# Patient Record
Sex: Male | Born: 1990 | Race: White | Hispanic: No | Marital: Single | Smoking: Never smoker
Health system: Southern US, Community
[De-identification: ages and names within clinical notes are randomized; demographics above are authoritative.]

## PROBLEM LIST (undated history)

## (undated) DIAGNOSIS — I10 Essential (primary) hypertension: Secondary | ICD-10-CM

## (undated) DIAGNOSIS — F845 Asperger's syndrome: Secondary | ICD-10-CM

## (undated) DIAGNOSIS — M1990 Unspecified osteoarthritis, unspecified site: Secondary | ICD-10-CM

## (undated) DIAGNOSIS — T8859XA Other complications of anesthesia, initial encounter: Secondary | ICD-10-CM

## (undated) DIAGNOSIS — F319 Bipolar disorder, unspecified: Secondary | ICD-10-CM

## (undated) DIAGNOSIS — M79604 Pain in right leg: Secondary | ICD-10-CM

## (undated) DIAGNOSIS — H9190 Unspecified hearing loss, unspecified ear: Secondary | ICD-10-CM

## (undated) DIAGNOSIS — T4145XA Adverse effect of unspecified anesthetic, initial encounter: Secondary | ICD-10-CM

## (undated) DIAGNOSIS — F419 Anxiety disorder, unspecified: Secondary | ICD-10-CM

## (undated) DIAGNOSIS — F32A Depression, unspecified: Secondary | ICD-10-CM

## (undated) DIAGNOSIS — IMO0001 Reserved for inherently not codable concepts without codable children: Secondary | ICD-10-CM

## (undated) DIAGNOSIS — R51 Headache: Secondary | ICD-10-CM

## (undated) DIAGNOSIS — K219 Gastro-esophageal reflux disease without esophagitis: Secondary | ICD-10-CM

## (undated) DIAGNOSIS — R519 Headache, unspecified: Secondary | ICD-10-CM

## (undated) DIAGNOSIS — F329 Major depressive disorder, single episode, unspecified: Secondary | ICD-10-CM

## (undated) HISTORY — PX: MIDDLE EAR SURGERY: SHX713

## (undated) HISTORY — PX: OTHER SURGICAL HISTORY: SHX169

## (undated) HISTORY — PX: FRACTURE SURGERY: SHX138

---
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
## 2010-10-07 ENCOUNTER — Emergency Department (HOSPITAL_COMMUNITY)
Admission: EM | Admit: 2010-10-07 | Discharge: 2010-10-07 | Disposition: A | Discharge status: Home / Self Care | Payer: Medicare Other | Attending: Emergency Medicine | Admitting: Emergency Medicine

## 2010-10-07 DIAGNOSIS — L02419 Cutaneous abscess of limb, unspecified: Secondary | ICD-10-CM | POA: Insufficient documentation

## 2010-10-07 DIAGNOSIS — F848 Other pervasive developmental disorders: Secondary | ICD-10-CM | POA: Insufficient documentation

## 2010-10-07 DIAGNOSIS — M79609 Pain in unspecified limb: Secondary | ICD-10-CM | POA: Insufficient documentation

## 2012-05-30 ENCOUNTER — Encounter (HOSPITAL_COMMUNITY): Payer: Self-pay | Admitting: Emergency Medicine

## 2012-05-30 ENCOUNTER — Emergency Department (HOSPITAL_COMMUNITY)
Admission: EM | Admit: 2012-05-30 | Discharge: 2012-05-30 | Disposition: A | Discharge status: Home / Self Care | Payer: Medicare Other | Attending: Emergency Medicine | Admitting: Emergency Medicine

## 2012-05-30 DIAGNOSIS — Y929 Unspecified place or not applicable: Secondary | ICD-10-CM | POA: Insufficient documentation

## 2012-05-30 DIAGNOSIS — S39012A Strain of muscle, fascia and tendon of lower back, initial encounter: Secondary | ICD-10-CM

## 2012-05-30 DIAGNOSIS — Z79899 Other long term (current) drug therapy: Secondary | ICD-10-CM | POA: Insufficient documentation

## 2012-05-30 DIAGNOSIS — I10 Essential (primary) hypertension: Secondary | ICD-10-CM | POA: Insufficient documentation

## 2012-05-30 DIAGNOSIS — X503XXA Overexertion from repetitive movements, initial encounter: Secondary | ICD-10-CM | POA: Insufficient documentation

## 2012-05-30 DIAGNOSIS — Y9389 Activity, other specified: Secondary | ICD-10-CM | POA: Insufficient documentation

## 2012-05-30 DIAGNOSIS — L272 Dermatitis due to ingested food: Secondary | ICD-10-CM | POA: Insufficient documentation

## 2012-05-30 DIAGNOSIS — K5900 Constipation, unspecified: Secondary | ICD-10-CM

## 2012-05-30 DIAGNOSIS — L239 Allergic contact dermatitis, unspecified cause: Secondary | ICD-10-CM

## 2012-05-30 DIAGNOSIS — S239XXA Sprain of unspecified parts of thorax, initial encounter: Secondary | ICD-10-CM | POA: Insufficient documentation

## 2012-05-30 HISTORY — DX: Essential (primary) hypertension: I10

## 2012-05-30 MED ORDER — POLYETHYLENE GLYCOL 3350 17 G PO PACK: 17.0000 g | PACK | Freq: Every day | ORAL | Status: AC

## 2012-05-30 MED ORDER — DEXAMETHASONE SODIUM PHOSPHATE 10 MG/ML IJ SOLN
10.0000 mg | Freq: Once | INTRAMUSCULAR | Status: AC
  Administered 2012-05-30: 10 mg via INTRAMUSCULAR
  Filled 2012-05-30: qty 1

## 2012-05-30 MED ORDER — IBUPROFEN 800 MG PO TABS
800.0000 mg | ORAL_TABLET | Freq: Once | ORAL | Status: AC
Start: 2012-05-30 — End: 2012-05-30
  Administered 2012-05-30: 800 mg via ORAL
  Filled 2012-05-30: qty 1

## 2012-05-30 NOTE — ED Notes (Signed)
Pt recently started going to the gym. Pt has not been drinking appropriate amount of water. Pt states that he did not do anything out of the ordinary, but that he started having a rash. Pt states that he is also having a had time going to the bathroom to have a bowel movement and new muscle aches.

## 2012-05-30 NOTE — ED Notes (Addendum)
Patient complaining of multiple complaints -- rash on chest, neck, and face that started two days ago that occurs after he earts.  Patient also reports that he has gained 10 pounds in the last two days, and complaining of constipation.  Denies urinary complaints.  Denies cardiac problems.  Patient complaining of intermittent upper right quadrant abdominal pain and right shoulder pain.

## 2012-05-30 NOTE — ED Provider Notes (Signed)
History     CSN: [id number]  Arrival date & time 05/30/12  4098   First MD Initiated Contact with Patient 05/30/12 2040      Chief Complaint  Patient presents with  . Rash  . Constipation    (Consider location/radiation/quality/duration/timing/severity/associated sxs/prior treatment) HPI Comments: 22 year old male presents to the emergency department complaining of a rash developing 3 hours ago after eating dinner. Triage summary states that this occurred 2 days ago, however patient states this occurred 3 hours ago. He had homemade tacos, and immediately after eating a rash developed on his face and neck. Describes the rash as high in burning, not itchy. While he was sitting in the waiting room in the ED the rash has began to subside. Denies difficulty breathing or swallowing. Also complaining of right-sided mid back pain for the past 3 weeks. States he has been exercising a lot more than normal to bring down his weight and has been doing a lot of lifting and physical activity. Describes the pain as intermittent, sharp and nonradiating. He has not tried any alleviating factors. Nothing in specific makes the pain worse. Patient also complaining of constipation over the past week. He has had a few small bowel movements, the last being this morning. States it was normal color and formed. Admits to eating a poor diet low in fiber.  Patient is a 22 y.o. male presenting with rash and constipation. The history is provided by the patient.  Rash Associated symptoms: no abdominal pain, no fatigue, no fever, no nausea, no shortness of breath and not vomiting   Constipation  Associated symptoms include rash. Pertinent negatives include no fever, no abdominal pain, no nausea, no vomiting and no chest pain.    Past Medical History  Diagnosis Date  . Hypertension     Past Surgical History  Procedure Laterality Date  . Middle ear surgery      History reviewed. No pertinent family  history.  History  Substance Use Topics  . Smoking status: Never Smoker   . Smokeless tobacco: Not on file  . Alcohol Use: No      Review of Systems  Constitutional: Negative for fever, chills and fatigue.  HENT: Negative for trouble swallowing.   Respiratory: Negative for chest tightness, shortness of breath and stridor.   Cardiovascular: Negative for chest pain.  Gastrointestinal: Positive for constipation. Negative for nausea, vomiting and abdominal pain.  Musculoskeletal: Positive for back pain.  Skin: Positive for rash.  Neurological: Negative for dizziness and light-headedness.  All other systems reviewed and are negative.    Allergies  Review of patient's allergies indicates no known allergies.  Home Medications   Current Outpatient Rx  Name  Route  Sig  Dispense  Refill  . polyethylene glycol (MIRALAX / GLYCOLAX) packet   Oral   Take 17 g by mouth daily.   14 each   0     BP 155/82  Pulse 89  Temp(Src) 98.5 F (36.9 C) (Oral)  Resp 18  SpO2 100%  Physical Exam  Nursing note and vitals reviewed. Constitutional: He is oriented to person, place, and time. He appears well-developed and well-nourished. No distress.  HENT:  Head: Normocephalic and atraumatic.  Eyes: Conjunctivae and EOM are normal. Pupils are equal, round, and reactive to light.  Neck: Normal range of motion. Neck supple.  Cardiovascular: Normal rate, regular rhythm, normal heart sounds and intact distal pulses.   Pulmonary/Chest: Effort normal and breath sounds normal. No stridor. No respiratory distress.  Abdominal: Soft. Normal appearance and bowel sounds are normal. There is no tenderness. There is no rigidity, no rebound and no guarding.  Musculoskeletal: Normal range of motion. He exhibits no edema.       Thoracic back: He exhibits normal range of motion and no bony tenderness.       Back:  Neurological: He is alert and oriented to person, place, and time.  Skin: Skin is warm and  dry. Rash noted.  Maculopapular rash present on face and neck. Mild warmth to touch. No surrounding erythema or edema concerning secondary infection. Spares mucosal surfaces.  Psychiatric: He has a normal mood and affect. His behavior is normal.    ED Course  Procedures (including critical care time)  Labs Reviewed - No data to display No results found.   1. Allergic dermatitis   2. Constipation   3. Back strain       MDM  22 y/o male with allergic dermatitis, constipation and back sprain. Decadron IM given in ED. Rash already beginning to subside prior to being seen while waiting. No respiratory compromise. Advised him to not eat the tacos. For his constipation I gave prescription for miralax and discussed high fiber diet. Regarding back strain, he will rest, ice and take ibuprofen. Return precautions discussed. Patient states understanding of plan and is agreeable.         Trevor Mace, PA-C 05/30/12 2127

## 2012-05-31 NOTE — ED Provider Notes (Signed)
Medical screening examination/treatment/procedure(s) were performed by non-physician practitioner and as supervising physician I was immediately available for consultation/collaboration.  Genesys Coggeshall J. Ulas Zuercher, MD 05/31/12 1215 

## 2012-12-20 ENCOUNTER — Encounter (HOSPITAL_COMMUNITY): Payer: Self-pay

## 2012-12-20 ENCOUNTER — Other Ambulatory Visit (HOSPITAL_COMMUNITY): Payer: Self-pay | Admitting: Internal Medicine

## 2012-12-20 ENCOUNTER — Ambulatory Visit (HOSPITAL_COMMUNITY)
Admission: RE | Admit: 2012-12-20 | Discharge: 2012-12-20 | Disposition: A | Discharge status: Home / Self Care | Payer: Medicare Other | Source: Ambulatory Visit | Attending: Internal Medicine | Admitting: Internal Medicine

## 2012-12-20 DIAGNOSIS — M439 Deforming dorsopathy, unspecified: Secondary | ICD-10-CM | POA: Insufficient documentation

## 2012-12-20 DIAGNOSIS — R1031 Right lower quadrant pain: Secondary | ICD-10-CM | POA: Insufficient documentation

## 2012-12-20 DIAGNOSIS — R911 Solitary pulmonary nodule: Secondary | ICD-10-CM | POA: Insufficient documentation

## 2012-12-20 MED ORDER — IOHEXOL 300 MG/ML  SOLN
100.0000 mL | Freq: Once | INTRAMUSCULAR | Status: AC | PRN
  Administered 2012-12-20: 100 mL via INTRAVENOUS

## 2012-12-20 MED ORDER — IOHEXOL 300 MG/ML  SOLN
50.0000 mL | Freq: Once | INTRAMUSCULAR | Status: AC | PRN
  Administered 2012-12-20: 50 mL via ORAL

## 2015-08-24 ENCOUNTER — Emergency Department (HOSPITAL_COMMUNITY)
Admission: EM | Admit: 2015-08-24 | Discharge: 2015-08-24 | Disposition: A | Discharge status: Home / Self Care | Payer: Medicare Other | Attending: Emergency Medicine | Admitting: Emergency Medicine

## 2015-08-24 ENCOUNTER — Encounter (HOSPITAL_COMMUNITY): Payer: Self-pay | Admitting: *Deleted

## 2015-08-24 DIAGNOSIS — L089 Local infection of the skin and subcutaneous tissue, unspecified: Secondary | ICD-10-CM

## 2015-08-24 DIAGNOSIS — I10 Essential (primary) hypertension: Secondary | ICD-10-CM | POA: Insufficient documentation

## 2015-08-24 DIAGNOSIS — L723 Sebaceous cyst: Secondary | ICD-10-CM | POA: Insufficient documentation

## 2015-08-24 DIAGNOSIS — R222 Localized swelling, mass and lump, trunk: Secondary | ICD-10-CM | POA: Diagnosis present

## 2015-08-24 DIAGNOSIS — Z79899 Other long term (current) drug therapy: Secondary | ICD-10-CM | POA: Insufficient documentation

## 2015-08-24 MED ORDER — LIDOCAINE-EPINEPHRINE (PF) 2 %-1:200000 IJ SOLN
20.0000 mL | Freq: Once | INTRAMUSCULAR | Status: AC
  Administered 2015-08-24: 20 mL
  Filled 2015-08-24: qty 20

## 2015-08-24 NOTE — Discharge Instructions (Signed)
Abscess An abscess is an infected area that contains a collection of pus and debris.It can occur in almost any part of the body. An abscess is also known as a furuncle or boil. CAUSES  An abscess occurs when tissue gets infected. This can occur from blockage of oil or sweat glands, infection of hair follicles, or a minor injury to the skin. As the body tries to fight the infection, pus collects in the area and creates pressure under the skin. This pressure causes pain. People with weakened immune systems have difficulty fighting infections and get certain abscesses more often.  SYMPTOMS Usually an abscess develops on the skin and becomes a painful mass that is red, warm, and tender. If the abscess forms under the skin, you may feel a moveable soft area under the skin. Some abscesses break open (rupture) on their own, but most will continue to get worse without care. The infection can spread deeper into the body and eventually into the bloodstream, causing you to feel ill.  DIAGNOSIS  Your caregiver will take your medical history and perform a physical exam. A sample of fluid may also be taken from the abscess to determine what is causing your infection. TREATMENT  Your caregiver may prescribe antibiotic medicines to fight the infection. However, taking antibiotics alone usually does not cure an abscess. Your caregiver may need to make a small cut (incision) in the abscess to drain the pus. In some cases, gauze is packed into the abscess to reduce pain and to continue draining the area. HOME CARE INSTRUCTIONS   Only take over-the-counter or prescription medicines for pain, discomfort, or fever as directed by your caregiver.  If you were prescribed antibiotics, take them as directed. Finish them even if you start to feel better.  If gauze is used, follow your caregiver's directions for changing the gauze.  To avoid spreading the infection:  Keep your draining abscess covered with a  bandage.  Wash your hands well.  Do not share personal care items, towels, or whirlpools with others.  Avoid skin contact with others.  Keep your skin and clothes clean around the abscess.  Keep all follow-up appointments as directed by your caregiver. SEEK MEDICAL CARE IF:   You have increased pain, swelling, redness, fluid drainage, or bleeding.  You have muscle aches, chills, or a general ill feeling.  You have a fever. MAKE SURE YOU:   Understand these instructions.  Will watch your condition.  Will get help right away if you are not doing well or get worse.   This information is not intended to replace advice given to you by your health care provider. Make sure you discuss any questions you have with your health care provider.   Document Released: 01/06/2005 Document Revised: 09/28/2011 Document Reviewed: 06/11/2011 Elsevier Interactive Patient Education 2016 Elsevier Inc.  Sebaceous Cyst Removal, Care After Refer to this sheet in the next few weeks. These instructions provide you with information about caring for yourself after your procedure. Your health care provider may also give you more specific instructions. Your treatment has been planned according to current medical practices, but problems sometimes occur. Call your health care provider if you have any problems or questions after your procedure. WHAT TO EXPECT AFTER THE PROCEDURE After your procedure, it is common to have:  Soreness in the area where your cyst was removed.  Tightness or itching from your skin sutures. HOME CARE INSTRUCTIONS  Take medicines only as directed by your health care provider.  If you were prescribed an antibiotic medicine, finish all of it even if you start to feel better.  Use antibiotic ointment as directed by your health care provider. Follow the instructions carefully.  There are many different ways to close and cover an incision, including stitches (sutures), skin glue, and  adhesive strips. Follow your health care provider's instructions about:  Incision care.  Bandage (dressing) changes and removal.  Incision closure removal.  Keep the bandage (dressing) dry until your health care provider says that it can be removed. Take sponge baths only. Ask your health care provider when you can start showering or taking a bath.  After your dressing is off, check your incision every day for signs of infection. Watch for:  Redness, swelling, or pain.  Fluid, blood, or pus.  You can return to your normal activities. Do not do anything that stretches or puts pressure on your incision.  You can return to your normal diet.  Keep all follow-up visits as directed by your health care provider. This is important. SEEK MEDICAL CARE IF:  You have a fever.  Your incision bleeds.  You have redness, swelling, or pain in the incision area.  You have fluid, blood, or pus coming from your incision.  Your cyst comes back after surgery.   This information is not intended to replace advice given to you by your health care provider. Make sure you discuss any questions you have with your health care provider.   Document Released: 04/19/2014 Document Reviewed: 04/19/2014 Elsevier Interactive Patient Education [employer]2016 Elsevier Inc.

## 2015-08-24 NOTE — ED Provider Notes (Signed)
CSN: [id number]     Arrival date & time 08/24/15  0107 History   First MD Initiated Contact with Patient 08/24/15 0301     Chief Complaint  Patient presents with  . Insect Bite     (Consider location/radiation/quality/duration/timing/severity/associated sxs/prior Treatment) HPI This is a 25 year old male with a past medical history of cystic acne who presents emergency Department with chief complaint of painful swelling of the back. Patient states that he had a "knot" on his back which has been increasingly worsening in size over the past 2 weeks. He states it is becoming incredibly uncomfortable. He is unable to sleep on his back or sit against a chair. He denies fevers, chills, myalgias, other signs of systemic infection. Past Medical History  Diagnosis Date  . Hypertension    Past Surgical History  Procedure Laterality Date  . Middle ear surgery     No family history on file. Social History  Substance Use Topics  . Smoking status: Never Smoker   . Smokeless tobacco: None  . Alcohol Use: No    Review of Systems   Ten systems reviewed and are negative for acute change, except as noted in the HPI.   Allergies  Review of patient's allergies indicates no known allergies.  Home Medications   Prior to Admission medications   Medication Sig Start Date End Date Taking? Authorizing Provider  polyethylene glycol (MIRALAX / GLYCOLAX) packet Take 17 g by mouth daily. 05/30/12   Robyn M Hess, PA-C   BP 152/92 mmHg  Pulse 91  Temp(Src) 98.1 F (36.7 C) (Oral)  Resp 18  Ht  (1.93 m)  Wt 119.296 kg  BMI 32.03 kg/m2  SpO2 98% Physical Exam Physical Exam  Nursing note and vitals reviewed. Constitutional: He appears well-developed and well-nourished. No distress.  HENT:  Head: Normocephalic and atraumatic.  Eyes: Conjunctivae normal are normal. No scleral icterus.  Neck: Normal range of motion. Neck supple.  Cardiovascular: Normal rate, regular rhythm and normal heart  sounds.   Pulmonary/Chest: Effort normal and breath sounds normal. No respiratory distress.  Abdominal: Soft. There is no tenderness.  Musculoskeletal: He exhibits no edema.  Neurological: He is alert.  Skin: Skin is warm and dry. He is not diaphoretic.  There is a 10 cm in diameter region of induration with about 3 cm of central fluctuance. It is tender to palpation. No surrounding erythema, streaking or signs of cellulitis. Appearance is consistent with an infected sebaceous cyst.  Psychiatric: His behavior is normal.     ED Course  Procedures (including critical care time) Labs Review Labs Reviewed - No data to display  Imaging Review No results found. I have personally reviewed and evaluated these images and lab results as part of my medical decision-making.   EKG Interpretation None     INCISION AND DRAINAGE Performed by: Arthor Captain Consent: Verbal consent obtained. Risks and benefits: risks, benefits and alternatives were discussed Type: abscess  Body area: Back  Anesthesia: local infiltration  Incision was made with a scalpel.  Local anesthetic: lidocaine 2 % with epinephrine  Anesthetic total: 6 ml  Complexity: complex Blunt dissection to break up loculations  Drainage: purulent/sebaceous   Drainage amount: Copious   Packing material: 1/4 in iodoform gauze  Patient tolerance: Patient tolerated the procedure well with no immediate complications.    MDM   Final diagnoses:  Infected sebaceous cyst of skin  Essential hypertension    Patient with infected sebaceous cyst. There is a large cavity  in the back, which was packed. The patient tolerated the procedure well. Asked him to return to the emergency department and urgent care or his primary care physician in 2 days for packing removal and wound check. Patient will ultimately need consult with surgery for removal of the cyst wall. Patient appears safe for discharge at this time. Discussed return  precautions    Arthor Captainbigail Carlisle Enke, PA-C 08/24/15 0640  Layla MawKristen N Ward, DO 08/24/15 [phone]

## 2015-08-24 NOTE — ED Notes (Signed)
The pt is c/o a bump on his posterior chest for one week  He just decided to have it checked tonight  Before it became better

## 2016-10-27 ENCOUNTER — Other Ambulatory Visit: Payer: Self-pay | Admitting: Orthopedic Surgery

## 2016-11-04 ENCOUNTER — Encounter (HOSPITAL_COMMUNITY): Payer: Self-pay

## 2016-11-04 ENCOUNTER — Encounter (HOSPITAL_COMMUNITY)
Admission: RE | Admit: 2016-11-04 | Discharge: 2016-11-04 | Disposition: A | Discharge status: Home / Self Care | Payer: Medicare Other | Source: Ambulatory Visit | Attending: Orthopedic Surgery | Admitting: Orthopedic Surgery

## 2016-11-04 ENCOUNTER — Other Ambulatory Visit: Payer: Self-pay

## 2016-11-04 DIAGNOSIS — M79604 Pain in right leg: Secondary | ICD-10-CM | POA: Insufficient documentation

## 2016-11-04 DIAGNOSIS — F845 Asperger's syndrome: Secondary | ICD-10-CM | POA: Diagnosis not present

## 2016-11-04 DIAGNOSIS — Z01818 Encounter for other preprocedural examination: Secondary | ICD-10-CM | POA: Diagnosis not present

## 2016-11-04 DIAGNOSIS — F319 Bipolar disorder, unspecified: Secondary | ICD-10-CM | POA: Diagnosis not present

## 2016-11-04 DIAGNOSIS — Z6833 Body mass index (BMI) 33.0-33.9, adult: Secondary | ICD-10-CM | POA: Diagnosis not present

## 2016-11-04 DIAGNOSIS — K219 Gastro-esophageal reflux disease without esophagitis: Secondary | ICD-10-CM | POA: Diagnosis not present

## 2016-11-04 DIAGNOSIS — I10 Essential (primary) hypertension: Secondary | ICD-10-CM | POA: Diagnosis not present

## 2016-11-04 DIAGNOSIS — H9190 Unspecified hearing loss, unspecified ear: Secondary | ICD-10-CM | POA: Diagnosis not present

## 2016-11-04 DIAGNOSIS — M5116 Intervertebral disc disorders with radiculopathy, lumbar region: Secondary | ICD-10-CM | POA: Diagnosis present

## 2016-11-04 HISTORY — DX: Gastro-esophageal reflux disease without esophagitis: K21.9

## 2016-11-04 HISTORY — DX: Adverse effect of unspecified anesthetic, initial encounter: T41.45XA

## 2016-11-04 HISTORY — DX: Unspecified osteoarthritis, unspecified site: M19.90

## 2016-11-04 HISTORY — DX: Unspecified hearing loss, unspecified ear: H91.90

## 2016-11-04 HISTORY — DX: Headache: R51

## 2016-11-04 HISTORY — DX: Bipolar disorder, unspecified: F31.9

## 2016-11-04 HISTORY — DX: Depression, unspecified: F32.A

## 2016-11-04 HISTORY — DX: Major depressive disorder, single episode, unspecified: F32.9

## 2016-11-04 HISTORY — DX: Other complications of anesthesia, initial encounter: T88.59XA

## 2016-11-04 HISTORY — DX: Pain in right leg: M79.604

## 2016-11-04 HISTORY — DX: Headache, unspecified: R51.9

## 2016-11-04 HISTORY — DX: Reserved for inherently not codable concepts without codable children: IMO0001

## 2016-11-04 HISTORY — DX: Asperger's syndrome: F84.5

## 2016-11-04 HISTORY — DX: Anxiety disorder, unspecified: F41.9

## 2016-11-04 LAB — CBC WITH DIFFERENTIAL/PLATELET
Basophils Absolute: 0 10*3/uL (ref 0.0–0.1)
Basophils Relative: 0 %
EOS PCT: 4 %
Eosinophils Absolute: 0.3 10*3/uL (ref 0.0–0.7)
HEMATOCRIT: 41.3 % (ref 39.0–52.0)
Hemoglobin: 14.7 g/dL (ref 13.0–17.0)
LYMPHS PCT: 35 %
Lymphs Abs: 2.7 10*3/uL (ref 0.7–4.0)
MCH: 29.6 pg (ref 26.0–34.0)
MCHC: 35.6 g/dL (ref 30.0–36.0)
MCV: 83.3 fL (ref 78.0–100.0)
MONO ABS: 0.3 10*3/uL (ref 0.1–1.0)
MONOS PCT: 4 %
NEUTROS ABS: 4.3 10*3/uL (ref 1.7–7.7)
Neutrophils Relative %: 57 %
Platelets: 275 10*3/uL (ref 150–400)
RBC: 4.96 MIL/uL (ref 4.22–5.81)
RDW: 12 % (ref 11.5–15.5)
WBC: 7.7 10*3/uL (ref 4.0–10.5)

## 2016-11-04 LAB — COMPREHENSIVE METABOLIC PANEL
ALT: 32 U/L (ref 17–63)
ANION GAP: 7 (ref 5–15)
AST: 31 U/L (ref 15–41)
Albumin: 4.5 g/dL (ref 3.5–5.0)
Alkaline Phosphatase: 62 U/L (ref 38–126)
BILIRUBIN TOTAL: 0.5 mg/dL (ref 0.3–1.2)
BUN: <5 mg/dL — ABNORMAL LOW (ref 6–20)
CO2: 27 mmol/L (ref 22–32)
Calcium: 9.4 mg/dL (ref 8.9–10.3)
Chloride: 105 mmol/L (ref 101–111)
Creatinine, Ser: 0.93 mg/dL (ref 0.61–1.24)
Glucose, Bld: 95 mg/dL (ref 65–99)
POTASSIUM: 4.1 mmol/L (ref 3.5–5.1)
Sodium: 139 mmol/L (ref 135–145)
Total Protein: 7.1 g/dL (ref 6.5–8.1)

## 2016-11-04 LAB — URINALYSIS, ROUTINE W REFLEX MICROSCOPIC
BILIRUBIN URINE: NEGATIVE
Glucose, UA: NEGATIVE mg/dL
Hgb urine dipstick: NEGATIVE
Ketones, ur: NEGATIVE mg/dL
Leukocytes, UA: NEGATIVE
NITRITE: NEGATIVE
PH: 5 (ref 5.0–8.0)
Protein, ur: NEGATIVE mg/dL
SPECIFIC GRAVITY, URINE: 1.005 (ref 1.005–1.030)

## 2016-11-04 LAB — SURGICAL PCR SCREEN
MRSA, PCR: NEGATIVE
Staphylococcus aureus: NEGATIVE

## 2016-11-04 LAB — TYPE AND SCREEN
ABO/RH(D): O POS
ANTIBODY SCREEN: NEGATIVE

## 2016-11-04 LAB — PROTIME-INR
INR: 0.99
PROTHROMBIN TIME: 13.1 s (ref 11.4–15.2)

## 2016-11-04 LAB — APTT: aPTT: 28 seconds (ref 24–36)

## 2016-11-04 LAB — ABO/RH: ABO/RH(D): O POS

## 2016-11-04 NOTE — Progress Notes (Signed)
Pt denies SOB, chest pain, and being under the care of a cardiologist. Pt denies having a stress test, echo and cardiac cath. Pt denies having a chest x ray and EKG within the last year. Pt denies having recent labs.  

## 2016-11-04 NOTE — Pre-Procedure Instructions (Signed)
    Elray BubaZachary T Laperle  11/04/2016      [facility] - [place] - [address] [place] [address] [address] [place] [id number] Phone: [phone] Fax: [phone]    Your procedure is scheduled on Wednesday, November 10, 2016.  [facility] Admitting at 3:15  P.M.  Call this number if you have problems the morning of surgery:  [phone]   Remember:  Do not eat food or drink liquids after midnight Tuesday, November 09, 2016  Take these medicines the morning of surgery with A SIP OF WATER:busPIRone (BUSPAR), if needed: acetaminophen (TYLENOL) for pain Stop taking Aspirin,vitamins, fish oil, Melatonin and herbal medications. Do not take any NSAIDs ie: Ibuprofen, Advil, Naproxen (Aleve), Motrin, BC and Goody Powder or any medication containing Aspirin; stop now.  Do not wear jewelry, make-up or nail polish.  Do not wear lotions, powders, or perfumes, or deoderant.  Do not shave 48 hours prior to surgery.  Men may shave face and neck.  Do not bring valuables to the hospital.  [facility] is not responsible for any belongings or valuables.  Contacts, dentures or bridgework may not be worn into surgery.  Leave your suitcase in the car.  After surgery it may be brought to your room. For patients admitted to the hospital, discharge time will be determined by your treatment team. Patients discharged the day of surgery will not be allowed to drive home.  Special instructions: Shower the night before surgery and the morning of surgery with CHG. Please read over the following fact sheets that you were given. Pain Booklet, Coughing and Deep Breathing, Blood Transfusion Information, MRSA Information and Surgical Site Infection Prevention

## 2016-11-09 MED ORDER — CEFAZOLIN SODIUM 10 G IJ SOLR
3.0000 g | INTRAMUSCULAR | Status: AC
  Administered 2016-11-10: 3 g via INTRAVENOUS
  Filled 2016-11-09: qty 3000

## 2016-11-09 NOTE — Progress Notes (Signed)
Confirmed with patient arrival time of [id number]

## 2016-11-10 ENCOUNTER — Ambulatory Visit (HOSPITAL_COMMUNITY): Payer: Medicare Other

## 2016-11-10 ENCOUNTER — Ambulatory Visit (HOSPITAL_COMMUNITY): Payer: Medicare Other | Admitting: Anesthesiology

## 2016-11-10 ENCOUNTER — Ambulatory Visit (HOSPITAL_COMMUNITY)
Admission: RE | Admit: 2016-11-10 | Discharge: 2016-11-10 | Disposition: A | Discharge status: Home / Self Care | Payer: Medicare Other | Source: Ambulatory Visit | Attending: Orthopedic Surgery | Admitting: Orthopedic Surgery

## 2016-11-10 ENCOUNTER — Ambulatory Visit (HOSPITAL_COMMUNITY)
Admission: RE | Disposition: A | Discharge status: Home / Self Care | Payer: Self-pay | Source: Ambulatory Visit | Attending: Orthopedic Surgery

## 2016-11-10 ENCOUNTER — Encounter (HOSPITAL_COMMUNITY): Payer: Self-pay | Admitting: Orthopedic Surgery

## 2016-11-10 DIAGNOSIS — F319 Bipolar disorder, unspecified: Secondary | ICD-10-CM | POA: Diagnosis not present

## 2016-11-10 DIAGNOSIS — K219 Gastro-esophageal reflux disease without esophagitis: Secondary | ICD-10-CM | POA: Insufficient documentation

## 2016-11-10 DIAGNOSIS — M5410 Radiculopathy, site unspecified: Secondary | ICD-10-CM

## 2016-11-10 DIAGNOSIS — I10 Essential (primary) hypertension: Secondary | ICD-10-CM | POA: Insufficient documentation

## 2016-11-10 DIAGNOSIS — H9190 Unspecified hearing loss, unspecified ear: Secondary | ICD-10-CM | POA: Insufficient documentation

## 2016-11-10 DIAGNOSIS — F845 Asperger's syndrome: Secondary | ICD-10-CM | POA: Diagnosis not present

## 2016-11-10 DIAGNOSIS — M5116 Intervertebral disc disorders with radiculopathy, lumbar region: Secondary | ICD-10-CM | POA: Diagnosis not present

## 2016-11-10 DIAGNOSIS — Z6833 Body mass index (BMI) 33.0-33.9, adult: Secondary | ICD-10-CM | POA: Insufficient documentation

## 2016-11-10 HISTORY — PX: LUMBAR LAMINECTOMY: SHX95

## 2016-11-10 SURGERY — MICRODISCECTOMY LUMBAR LAMINECTOMY
Anesthesia: General | Site: Back | Laterality: Right

## 2016-11-10 MED ORDER — HYDROMORPHONE HCL 1 MG/ML IJ SOLN: 0.2500 mg | INTRAMUSCULAR | Status: DC | PRN

## 2016-11-10 MED ORDER — METHYLENE BLUE 0.5 % INJ SOLN
INTRAVENOUS | Status: AC
  Filled 2016-11-10: qty 10

## 2016-11-10 MED ORDER — LACTATED RINGERS IV SOLN
INTRAVENOUS | Status: DC
  Administered 2016-11-10 (×2): via INTRAVENOUS

## 2016-11-10 MED ORDER — PHENYLEPHRINE 40 MCG/ML (10ML) SYRINGE FOR IV PUSH (FOR BLOOD PRESSURE SUPPORT)
PREFILLED_SYRINGE | INTRAVENOUS | Status: AC
  Filled 2016-11-10: qty 10

## 2016-11-10 MED ORDER — BUPIVACAINE-EPINEPHRINE 0.25% -1:200000 IJ SOLN
INTRAMUSCULAR | Status: DC | PRN
  Administered 2016-11-10: 8 mL

## 2016-11-10 MED ORDER — FENTANYL CITRATE (PF) 250 MCG/5ML IJ SOLN
INTRAMUSCULAR | Status: AC
  Filled 2016-11-10: qty 5

## 2016-11-10 MED ORDER — METHYLPREDNISOLONE ACETATE 40 MG/ML IJ SUSP
INTRAMUSCULAR | Status: DC | PRN
  Administered 2016-11-10: 40 mg via INTRA_ARTICULAR

## 2016-11-10 MED ORDER — ONDANSETRON HCL 4 MG/2ML IJ SOLN
INTRAMUSCULAR | Status: AC
  Filled 2016-11-10: qty 2

## 2016-11-10 MED ORDER — BUPIVACAINE LIPOSOME 1.3 % IJ SUSP
INTRAMUSCULAR | Status: DC | PRN
  Administered 2016-11-10: 20 mL

## 2016-11-10 MED ORDER — DEXMEDETOMIDINE HCL 200 MCG/2ML IV SOLN
INTRAVENOUS | Status: DC | PRN
  Administered 2016-11-10 (×5): 4 ug via INTRAVENOUS

## 2016-11-10 MED ORDER — PROPOFOL 10 MG/ML IV BOLUS
INTRAVENOUS | Status: DC | PRN
  Administered 2016-11-10: 200 mg via INTRAVENOUS
  Administered 2016-11-10: 50 mg via INTRAVENOUS

## 2016-11-10 MED ORDER — SUGAMMADEX SODIUM 500 MG/5ML IV SOLN
INTRAVENOUS | Status: AC
  Filled 2016-11-10: qty 5

## 2016-11-10 MED ORDER — PROPOFOL 10 MG/ML IV BOLUS
INTRAVENOUS | Status: AC
  Filled 2016-11-10: qty 20

## 2016-11-10 MED ORDER — MEPERIDINE HCL 25 MG/ML IJ SOLN: 6.2500 mg | INTRAMUSCULAR | Status: DC | PRN

## 2016-11-10 MED ORDER — 0.9 % SODIUM CHLORIDE (POUR BTL) OPTIME
TOPICAL | Status: DC | PRN
  Administered 2016-11-10: 1000 mL

## 2016-11-10 MED ORDER — DEXAMETHASONE SODIUM PHOSPHATE 10 MG/ML IJ SOLN
INTRAMUSCULAR | Status: DC | PRN
  Administered 2016-11-10: 4 mg via INTRAVENOUS

## 2016-11-10 MED ORDER — MIDAZOLAM HCL 2 MG/2ML IJ SOLN
INTRAMUSCULAR | Status: AC
  Filled 2016-11-10: qty 2

## 2016-11-10 MED ORDER — EPHEDRINE 5 MG/ML INJ
INTRAVENOUS | Status: AC
  Filled 2016-11-10: qty 10

## 2016-11-10 MED ORDER — CHLORHEXIDINE GLUCONATE 4 % EX LIQD: 60.0000 mL | Freq: Once | CUTANEOUS | Status: DC

## 2016-11-10 MED ORDER — KETOROLAC TROMETHAMINE 0.5 % OP SOLN
1.0000 [drp] | Freq: Three times a day (TID) | OPHTHALMIC | Status: DC | PRN
  Administered 2016-11-10: 1 [drp] via OPHTHALMIC
  Filled 2016-11-10 (×2): qty 3

## 2016-11-10 MED ORDER — PHENYLEPHRINE 40 MCG/ML (10ML) SYRINGE FOR IV PUSH (FOR BLOOD PRESSURE SUPPORT)
PREFILLED_SYRINGE | INTRAVENOUS | Status: DC | PRN
  Administered 2016-11-10 (×2): 80 ug via INTRAVENOUS
  Administered 2016-11-10: 40 ug via INTRAVENOUS
  Administered 2016-11-10: 120 ug via INTRAVENOUS

## 2016-11-10 MED ORDER — SUGAMMADEX SODIUM 200 MG/2ML IV SOLN
INTRAVENOUS | Status: DC | PRN
  Administered 2016-11-10: 250 mg via INTRAVENOUS

## 2016-11-10 MED ORDER — BSS IO SOLN
15.0000 mL | Freq: Once | INTRAOCULAR | Status: DC
  Filled 2016-11-10: qty 15

## 2016-11-10 MED ORDER — DEXAMETHASONE SODIUM PHOSPHATE 10 MG/ML IJ SOLN
INTRAMUSCULAR | Status: AC
  Filled 2016-11-10: qty 1

## 2016-11-10 MED ORDER — ROCURONIUM BROMIDE 10 MG/ML (PF) SYRINGE
PREFILLED_SYRINGE | INTRAVENOUS | Status: AC
  Filled 2016-11-10: qty 5

## 2016-11-10 MED ORDER — METHYLENE BLUE 0.5 % INJ SOLN
INTRAVENOUS | Status: DC | PRN
  Administered 2016-11-10: 1 mL via INTRADERMAL

## 2016-11-10 MED ORDER — THROMBIN 20000 UNITS EX SOLR
CUTANEOUS | Status: AC
  Filled 2016-11-10: qty 20000

## 2016-11-10 MED ORDER — ROCURONIUM BROMIDE 100 MG/10ML IV SOLN
INTRAVENOUS | Status: DC | PRN
  Administered 2016-11-10: 50 mg via INTRAVENOUS
  Administered 2016-11-10: 10 mg via INTRAVENOUS
  Administered 2016-11-10: 5 mg via INTRAVENOUS
  Administered 2016-11-10: 10 mg via INTRAVENOUS
  Administered 2016-11-10: 5 mg via INTRAVENOUS

## 2016-11-10 MED ORDER — PROPOFOL 10 MG/ML IV BOLUS
INTRAVENOUS | Status: AC
Start: 2016-11-10 — End: ?
  Filled 2016-11-10: qty 20

## 2016-11-10 MED ORDER — MIDAZOLAM HCL 2 MG/2ML IJ SOLN: 0.5000 mg | Freq: Once | INTRAMUSCULAR | Status: DC | PRN

## 2016-11-10 MED ORDER — LIDOCAINE 2% (20 MG/ML) 5 ML SYRINGE
INTRAMUSCULAR | Status: AC
  Filled 2016-11-10: qty 10

## 2016-11-10 MED ORDER — SUCCINYLCHOLINE CHLORIDE 200 MG/10ML IV SOSY
PREFILLED_SYRINGE | INTRAVENOUS | Status: AC
  Filled 2016-11-10: qty 10

## 2016-11-10 MED ORDER — FENTANYL CITRATE (PF) 100 MCG/2ML IJ SOLN
INTRAMUSCULAR | Status: DC | PRN
  Administered 2016-11-10: 200 ug via INTRAVENOUS
  Administered 2016-11-10: 50 ug via INTRAVENOUS

## 2016-11-10 MED ORDER — DEXAMETHASONE SODIUM PHOSPHATE 10 MG/ML IJ SOLN
INTRAMUSCULAR | Status: AC
Start: 2016-11-10 — End: ?
  Filled 2016-11-10: qty 1

## 2016-11-10 MED ORDER — METHYLPREDNISOLONE ACETATE 40 MG/ML IJ SUSP
INTRAMUSCULAR | Status: AC
  Filled 2016-11-10: qty 1

## 2016-11-10 MED ORDER — ONDANSETRON HCL 4 MG/2ML IJ SOLN
INTRAMUSCULAR | Status: DC | PRN
  Administered 2016-11-10: 4 mg via INTRAVENOUS

## 2016-11-10 MED ORDER — MIDAZOLAM HCL 5 MG/5ML IJ SOLN
INTRAMUSCULAR | Status: DC | PRN
  Administered 2016-11-10: 2 mg via INTRAVENOUS

## 2016-11-10 MED ORDER — BUPIVACAINE LIPOSOME 1.3 % IJ SUSP
20.0000 mL | Freq: Once | INTRAMUSCULAR | Status: DC
  Filled 2016-11-10: qty 20

## 2016-11-10 MED ORDER — LIDOCAINE HCL (CARDIAC) 20 MG/ML IV SOLN
INTRAVENOUS | Status: DC | PRN
  Administered 2016-11-10: 40 mg via INTRAVENOUS

## 2016-11-10 MED ORDER — PROMETHAZINE HCL 25 MG/ML IJ SOLN: 6.2500 mg | INTRAMUSCULAR | Status: DC | PRN

## 2016-11-10 SURGICAL SUPPLY — 64 items
BENZOIN TINCTURE PRP APPL 2/3 (GAUZE/BANDAGES/DRESSINGS) ×3 IMPLANT
BUR ROUND PRECISION 4.0 (BURR) ×2 IMPLANT
BUR ROUND PRECISION 4.0MM (BURR) ×1
CANISTER SUCT 3000ML PPV (MISCELLANEOUS) ×3 IMPLANT
CLOSURE STERI-STRIP 1/2X4 (GAUZE/BANDAGES/DRESSINGS) ×1
CLOSURE WOUND 1/2 X4 (GAUZE/BANDAGES/DRESSINGS) ×1
CLSR STERI-STRIP ANTIMIC 1/2X4 (GAUZE/BANDAGES/DRESSINGS) ×2 IMPLANT
CONT SPEC 4OZ CLIKSEAL STRL BL (MISCELLANEOUS) ×3 IMPLANT
COVER SURGICAL LIGHT HANDLE (MISCELLANEOUS) ×3 IMPLANT
DRAIN CHANNEL 10F 3/8 F FF (DRAIN) IMPLANT
DRAPE POUCH INSTRU U-SHP 10X18 (DRAPES) ×6 IMPLANT
DRAPE SURG 17X23 STRL (DRAPES) ×12 IMPLANT
DURAPREP 26ML APPLICATOR (WOUND CARE) ×3 IMPLANT
ELECT BLADE 4.0 EZ CLEAN MEGAD (MISCELLANEOUS) ×3
ELECT BLADE 6.5 EXT (BLADE) IMPLANT
ELECT CAUTERY BLADE 6.4 (BLADE) ×3 IMPLANT
ELECT REM PT RETURN 9FT ADLT (ELECTROSURGICAL) ×3
ELECTRODE BLDE 4.0 EZ CLN MEGD (MISCELLANEOUS) ×1 IMPLANT
ELECTRODE REM PT RTRN 9FT ADLT (ELECTROSURGICAL) ×1 IMPLANT
EVACUATOR SILICONE 100CC (DRAIN) IMPLANT
FILTER STRAW FLUID ASPIR (MISCELLANEOUS) ×3 IMPLANT
GAUZE SPONGE 4X4 12PLY STRL (GAUZE/BANDAGES/DRESSINGS) ×3 IMPLANT
GAUZE SPONGE 4X4 16PLY XRAY LF (GAUZE/BANDAGES/DRESSINGS) ×6 IMPLANT
GLOVE BIO SURGEON STRL SZ7 (GLOVE) ×3 IMPLANT
GLOVE BIO SURGEON STRL SZ8 (GLOVE) ×3 IMPLANT
GLOVE BIOGEL PI IND STRL 7.0 (GLOVE) ×1 IMPLANT
GLOVE BIOGEL PI IND STRL 8 (GLOVE) ×1 IMPLANT
GLOVE BIOGEL PI INDICATOR 7.0 (GLOVE) ×2
GLOVE BIOGEL PI INDICATOR 8 (GLOVE) ×2
GOWN STRL REUS W/ TWL LRG LVL3 (GOWN DISPOSABLE) ×2 IMPLANT
GOWN STRL REUS W/TWL LRG LVL3 (GOWN DISPOSABLE) ×4
IV CATH 14GX2 1/4 (CATHETERS) ×3 IMPLANT
KIT BASIN OR (CUSTOM PROCEDURE TRAY) ×3 IMPLANT
KIT ROOM TURNOVER OR (KITS) ×3 IMPLANT
NEEDLE 18GX1X1/2 (RX/OR ONLY) (NEEDLE) ×3 IMPLANT
NEEDLE HYPO 25GX1X1/2 BEV (NEEDLE) ×3 IMPLANT
NEEDLE SPNL 18GX3.5 QUINCKE PK (NEEDLE) ×6 IMPLANT
NEEDLE SPNL 22GX3.5 QUINCKE BK (NEEDLE) ×3 IMPLANT
NS IRRIG 1000ML POUR BTL (IV SOLUTION) ×3 IMPLANT
PACK LAMINECTOMY ORTHO (CUSTOM PROCEDURE TRAY) ×3 IMPLANT
PACK UNIVERSAL I (CUSTOM PROCEDURE TRAY) ×3 IMPLANT
PAD ARMBOARD 7.5X6 YLW CONV (MISCELLANEOUS) ×6 IMPLANT
PATTIES SURGICAL .5 X.5 (GAUZE/BANDAGES/DRESSINGS) IMPLANT
PATTIES SURGICAL .5 X1 (DISPOSABLE) ×3 IMPLANT
PATTIES SURGICAL 1X1 (DISPOSABLE) IMPLANT
SPONGE SURGIFOAM ABS GEL 100 (HEMOSTASIS) ×3 IMPLANT
STRIP CLOSURE SKIN 1/2X4 (GAUZE/BANDAGES/DRESSINGS) ×2 IMPLANT
SUT ETHILON 3 0 FSL (SUTURE) ×3 IMPLANT
SUT VIC AB 0 CT1 27 (SUTURE) ×2
SUT VIC AB 0 CT1 27XBRD ANBCTR (SUTURE) ×1 IMPLANT
SUT VIC AB 0 CT2 27 (SUTURE) ×3 IMPLANT
SUT VIC AB 1 CT1 18XCR BRD 8 (SUTURE) ×1 IMPLANT
SUT VIC AB 1 CT1 8-18 (SUTURE) ×2
SUT VIC AB 2-0 CT2 18 VCP726D (SUTURE) ×3 IMPLANT
SYR 20CC LL (SYRINGE) ×3 IMPLANT
SYR BULB IRRIGATION 50ML (SYRINGE) ×3 IMPLANT
SYR CONTROL 10ML LL (SYRINGE) ×3 IMPLANT
SYR TB 1ML 26GX3/8 SAFETY (SYRINGE) ×6 IMPLANT
SYR TB 1ML LUER SLIP (SYRINGE) ×6 IMPLANT
TAPE CLOTH SURG 4X10 WHT LF (GAUZE/BANDAGES/DRESSINGS) ×3 IMPLANT
TOWEL OR 17X24 6PK STRL BLUE (TOWEL DISPOSABLE) ×3 IMPLANT
TOWEL OR 17X26 10 PK STRL BLUE (TOWEL DISPOSABLE) ×3 IMPLANT
WATER STERILE IRR 1000ML POUR (IV SOLUTION) ×3 IMPLANT
YANKAUER SUCT BULB TIP NO VENT (SUCTIONS) ×3 IMPLANT

## 2016-11-10 NOTE — Anesthesia Procedure Notes (Deleted)
Date/Time: 11/10/2016 1:43 PM Performed by: Rogelia BogaMUELLER, Damaso Laday P

## 2016-11-10 NOTE — H&P (Signed)
PREOPERATIVE H&P  Chief Complaint: Right leg pain  HPI: [name] is a 26 y.o. male who presents with ongoing pain in the right leg x 1 year  MRI reveals a large HNP on the right at L4/5  Patient has failed multiple forms of conservative care and continues to have pain (see office notes for additional details regarding the patient's full course of treatment)  Past Medical History:  Diagnosis Date  . Anxiety   . Arthritis    back  . Asperger's disorder   . Bipolar disorder (HCC)   . Complication of anesthesia     " during an ear surgery I woke up confused which resulted in anger or a panic attack."   . Depression   . GERD (gastroesophageal reflux disease)   . Headache   . Hearing impairment   . Hypertension   . Right leg pain    Past Surgical History:  Procedure Laterality Date  . ear surgeries    . FRACTURE SURGERY     right hand 5th digit  . MIDDLE EAR SURGERY     Social History   Social History  . Marital status: Single    Spouse name: N/A  . Number of children: N/A  . Years of education: N/A   Social History Main Topics  . Smoking status: Never Smoker  . Smokeless tobacco: Never Used  . Alcohol use No  . Drug use: No  . Sexual activity: Not on file   Other Topics Concern  . Not on file   Social History Narrative  . No narrative on file   Family History  Problem Relation Age of Onset  . Hypercholesterolemia Father   . Heart murmur Brother   . Hypertension Brother    Allergies  Allergen Reactions  . Other     Hydroxycut caused a red rash on the face   Prior to Admission medications   Medication Sig Start Date End Date Taking? Authorizing Provider  acetaminophen (TYLENOL) 500 MG tablet Take 1,000 mg by mouth every 8 (eight) hours as needed for mild pain.   Yes [provider]  busPIRone (BUSPAR) 30 MG tablet Take 15-30 mg by mouth See admin instructions. Takes 15 mg in the morning and 30 mg at 1400 and 15 mg at bedtime   Yes  [provider]  gabapentin (NEURONTIN) 100 MG capsule Take 200 mg by mouth at bedtime.   Yes [provider]  Melatonin 1 MG CAPS Take 2 mg by mouth at bedtime.   Yes [provider]  sertraline (ZOLOFT) 50 MG tablet Take 75 mg by mouth at bedtime. Takes 1.5 tablet at bedtime   Yes [provider]  ziprasidone (GEODON) 40 MG capsule Take 40 mg by mouth at bedtime.   Yes [provider]  polyethylene glycol (MIRALAX / GLYCOLAX) packet Take 17 g by mouth daily. Patient not taking: Reported on 11/02/2016 05/30/12   Kathrynn SpeedHess, Robyn M, PA-C     All other systems have been reviewed and were otherwise negative with the exception of those mentioned in the HPI and as above.  Physical Exam: There were no vitals filed for this visit.  General: Alert, no acute distress Cardiovascular: No pedal edema Respiratory: No cyanosis, no use of accessory musculature Skin: No lesions in the area of chief complaint Neurologic: Sensation intact distally Psychiatric: Patient is competent for consent with normal mood and affect Lymphatic: No axillary or cervical lymphadenopathy  MUSCULOSKELETAL: + SLR  on the right  Assessment/Plan: RIGHT LEG PAIN  Plan for Procedure(s): RIGHT SIDED LUMBAR 4-5 MICRODISECTOMY   Emilee HeroUMONSKI,Lamaria Hildebrandt LEONARD, MD 11/10/2016 8:06 AM

## 2016-11-10 NOTE — Anesthesia Preprocedure Evaluation (Addendum)
Anesthesia Evaluation  Patient identified by MRN, date of birth, ID band Patient awake    Reviewed: Allergy & Precautions, NPO status , Patient's Chart, lab work & pertinent test results  History of Anesthesia Complications Negative for: history of anesthetic complications  Airway Mallampati: I  TM Distance: >3 FB Neck ROM: Full    Dental  (+) Dental Advisory Given, Teeth Intact   Pulmonary neg pulmonary ROS,    breath sounds clear to auscultation       Cardiovascular hypertension,  Rhythm:Regular Rate:Normal     Neuro/Psych Anxiety Depression Bipolar Disorder Back pain    GI/Hepatic negative GI ROS, Neg liver ROS, neg GERD  ,  Endo/Other  Morbid obesity  Renal/GU negative Renal ROS     Musculoskeletal negative musculoskeletal ROS (+)   Abdominal (+) + obese,   Peds  Hematology negative hematology ROS (+)   Anesthesia Other Findings   Reproductive/Obstetrics                           Anesthesia Physical Anesthesia Plan  ASA: II  Anesthesia Plan: General   Post-op Pain Management:    Induction: Intravenous  PONV Risk Score and Plan: 3 and Ondansetron, Dexamethasone and Midazolam  Airway Management Planned: Oral ETT  Additional Equipment:   Intra-op Plan:   Post-operative Plan: Extubation in OR  Informed Consent: I have reviewed the patients History and Physical, chart, labs and discussed the procedure including the risks, benefits and alternatives for the proposed anesthesia with the patient or authorized representative who has indicated his/her understanding and acceptance.   Dental advisory given  Plan Discussed with: CRNA, Surgeon and Anesthesiologist  Anesthesia Plan Comments: (Plan routine monitors, GETA)       Anesthesia Quick Evaluation

## 2016-11-10 NOTE — OR Nursing (Signed)
Remaining eye drops given to parent with instructions to administer PRN on d/c intructions.

## 2016-11-10 NOTE — Transfer of Care (Cosign Needed)
Immediate Anesthesia Transfer of Care Note  Patient: [name]  Procedure(s) Performed: Procedure(s) with comments: RIGHT SIDED LUMBAR 4-5 MICRODISECTOMY (Right) - RIGHT SIDED LUMBAR 4-5 MICRODISECTOMY REQUESTED TIME 2HRS. INTRA-OP X-RAY.  Patient Location: PACU  Anesthesia Type:General  Level of Consciousness: awake  Airway & Oxygen Therapy: Patient Spontanous Breathing and Patient connected to face mask oxygen  Post-op Assessment: Report given to RN, Post -op Vital signs reviewed and stable and Patient moving all extremities X 4  Post vital signs: Reviewed and stable  Last Vitals:  Vitals:   11/10/16 1003  BP: (!) 155/83  Pulse: 75  Resp: 20  Temp: 36.9 C    Last Pain:  Vitals:   11/10/16 1003  TempSrc: Oral         Complications: No apparent anesthesia complications

## 2016-11-10 NOTE — Anesthesia Procedure Notes (Signed)
Procedure Name: Intubation Date/Time: 11/10/2016 1:43 PM Performed by: Carney Living Pre-anesthesia Checklist: Patient identified, Emergency Drugs available, Suction available, Patient being monitored and Timeout performed Patient Re-evaluated:Patient Re-evaluated prior to induction Oxygen Delivery Method: Circle system utilized Preoxygenation: Pre-oxygenation with 100% oxygen Induction Type: IV induction Ventilation: Mask ventilation without difficulty Laryngoscope Size: Mac and 4 Grade View: Grade I Tube type: Oral Tube size: 7.5 mm Number of attempts: 1 Airway Equipment and Method: Stylet Placement Confirmation: ETT inserted through vocal cords under direct vision and breath sounds checked- equal and bilateral Secured at: 23 cm Tube secured with: Tape Dental Injury: Teeth and Oropharynx as per pre-operative assessment and Dental damage

## 2016-11-10 NOTE — Anesthesia Postprocedure Evaluation (Signed)
Anesthesia Post Note  Patient: [name]  Procedure(s) Performed: Procedure(s) (LRB): RIGHT SIDED LUMBAR 4-5 MICRODISECTOMY (Right)     Patient location during evaluation: PACU Anesthesia Type: General Level of consciousness: awake and alert Pain management: pain level controlled Vital Signs Assessment: post-procedure vital signs reviewed and stable Respiratory status: spontaneous breathing, nonlabored ventilation, respiratory function stable and patient connected to nasal cannula oxygen Cardiovascular status: blood pressure returned to baseline and stable Postop Assessment: no signs of nausea or vomiting Anesthetic complications: no    Last Vitals:  Vitals:   11/10/16 1730 11/10/16 1738  BP: (!) 143/87 (!) 140/94  Pulse: 94 97  Resp: 16   Temp: (!) 36.4 C     Last Pain:  Vitals:   11/10/16 1003  TempSrc: Oral                 Shelton SilvasKevin D Lareina Espino

## 2016-11-11 ENCOUNTER — Encounter (HOSPITAL_COMMUNITY): Payer: Self-pay | Admitting: Orthopedic Surgery

## 2016-11-11 NOTE — Op Note (Signed)
Frank [name]:  [name], Frank [name]              ACCOUNT NO.:  [id number]  MEDICAL RECORD NO.:  [id number]  PHYSICIAN:  Estill BambergMark Bernardette Waldron, MD      DATE OF BIRTH:  08/15/1990  DATE OF PROCEDURE:  11/10/2016                              OPERATIVE REPORT   PREOPERATIVE DIAGNOSES: 1. Chronic right large L4-L5 disk herniation, causing severe right L5     nerve compression. 2. Right-sided L5 radiculopathy, chronic.  POSTOPERATIVE DIAGNOSES: 1. Chronic right large L4-L5 disk herniation, causing severe right L5     nerve compression. 2. Right-sided L5 radiculopathy, chronic.  PROCEDURE:  Right-sided L4-L5 laminotomy with partial facetectomy, with complex removal of chronic, calcified, adherent L4-L5 intervertebral disk herniation.  SURGEON:  Estill BambergMark Hayato Guaman, M.D.  ASSISTANT:  Jason CoopKayla McKenzie, PA-C.  ANESTHESIA:  General endotracheal anesthesia.  COMPLICATIONS:  None.  DISPOSITION:  Stable.  ESTIMATED BLOOD LOSS:  Minimal.  INDICATIONS FOR SURGERY:  Briefly, the patient is a pleasant 26 year old male, who did present to me with an over 1 year history of ongoing pain in the right leg.  He did have an MRI revealing a moderate-to-large right L4-L5 herniated disk.  This particular MRI was from January.  We did discuss proceeding with surgery at that point in time, and I did proceed with getting an updated MRI.  The updated MRI did reveal an ongoing large right-sided L4-L5 herniated disk.  The updated MRI did suggest that there was obvious chronicity to the herniation, and it did appear to be calcified and chronic on the updated MRI.  Given the patient's ongoing pain, we did discuss proceeding with the procedure reflected above.  The patient was fully aware of the risks and limitations of surgery, including the risk of advancement of his degenerative disk disease and his risk for a recurrent disk herniation. He did clearly understand that his risk for recurrent herniation was increased, given the size  and chronicity of the herniated disk fragment.  DESCRIPTION OF PROCEDURE:  On November 10, 2016, the patient was brought to surgery and general endotracheal anesthesia was administered.  The patient was placed prone on a well-padded flat Jackson bed with a spinal frame.  Antibiotics were given and a time-out procedure was performed. The back was prepped and draped in the usual fashion.  A midline incision was made overlying the L4-L5 intervertebral space.  I then removed the inferior and medial aspect of the L4 lamina.  The lateral aspect of the ligamentum flavum was identified and resected.  The dura and traversing right L5 nerve was noted.  I was able to medially retract the nerve.  Immediately ventral to the nerve was noted to be a protruded L4-L5 intervertebral disk herniation.  I did tease away the superficial layers overlying the herniated disk fragment, however, it was apparent that the herniated fragment was very chronic and calcified.  I did gently use reverse angled Epstein curettes and nerve hooks in order to tease away the herniated fragments, however, there were no loose fragments that readily declare themselves.  There was however noted to be multiple fragments, which were adherent to the vertebral body above and below the L4-L5 intervertebral space.  Therefore, in order to accomplish the decompression, I did need to use a reverse angled Epstein curette very liberally, to displace multiple intervertebral disk fragments into the intervertebral  space, after which point they were removed using a number of straight up-biting and down biting micropituitary rongeurs.  Of note, the decompression portion of the procedure was rather extensive and meticulous.  Typically, removing the disk fragments for this type of procedure takes approximately 15-20 minutes.  In this particular case, it did take approximately 75 minutes, given the fact that there were multiple chronic and adherent  disk fragments.  I was ultimately able to entirely decompress the traversing right L5 nerve.  At the termination of the decompression, it was noted to be freely mobile both medially and laterally.  There was no extravasation of cerebrospinal fluid noted throughout the entire surgery.  The wound, at this point, was copiously irrigated.  I then placed Gelfoam into the laminotomy defect.  I then introduced approximately 20 mg of Depo-Medrol about the epidural space.  Prior to this, the wound was copiously irrigated with a total of approximately 1 L of normal saline.  The wound was then closed in layers using #1 Vicryl followed by 2-0 Vicryl, followed by 4-0 Monocryl.  Benzoin and Steri- Strips were applied followed by sterile dressing.  All instrument counts were correct at the termination of the procedure.  Of note, Jason CoopKayla McKenzie was my assistant throughout surgery, and did aid in retraction, suctioning, and closure from start to finish.     Estill BambergMark Alexiana Laverdure, MD   ______________________________ Estill BambergMark Cannie Muckle, MD    MD/MEDQ  D:  11/10/2016  T:  11/10/2016  Job:  [id number]

## 2016-11-29 MED FILL — Thrombin For Soln 20000 Unit: CUTANEOUS | Qty: 1 | Status: AC

## 2017-12-11 ENCOUNTER — Emergency Department (HOSPITAL_COMMUNITY)
Admission: EM | Admit: 2017-12-11 | Discharge: 2017-12-11 | Disposition: A | Discharge status: Home / Self Care | Payer: Medicare Other | Attending: Emergency Medicine | Admitting: Emergency Medicine

## 2017-12-11 ENCOUNTER — Encounter (HOSPITAL_COMMUNITY): Payer: Self-pay | Admitting: Emergency Medicine

## 2017-12-11 ENCOUNTER — Emergency Department (HOSPITAL_COMMUNITY): Payer: Medicare Other

## 2017-12-11 ENCOUNTER — Other Ambulatory Visit: Payer: Self-pay

## 2017-12-11 DIAGNOSIS — Z79899 Other long term (current) drug therapy: Secondary | ICD-10-CM | POA: Diagnosis not present

## 2017-12-11 DIAGNOSIS — R0789 Other chest pain: Secondary | ICD-10-CM | POA: Insufficient documentation

## 2017-12-11 DIAGNOSIS — I10 Essential (primary) hypertension: Secondary | ICD-10-CM | POA: Insufficient documentation

## 2017-12-11 LAB — BASIC METABOLIC PANEL
Anion gap: 9 (ref 5–15)
BUN: 9 mg/dL (ref 6–20)
CHLORIDE: 101 mmol/L (ref 98–111)
CO2: 27 mmol/L (ref 22–32)
CREATININE: 0.94 mg/dL (ref 0.61–1.24)
Calcium: 9.4 mg/dL (ref 8.9–10.3)
GFR calc Af Amer: >60 mL/min (ref >60)
Glucose, Bld: 100 mg/dL — ABNORMAL HIGH (ref 70–99)
POTASSIUM: 4 mmol/L (ref 3.5–5.1)
Sodium: 137 mmol/L (ref 135–145)

## 2017-12-11 LAB — CBC
HCT: 46.9 % (ref 39.0–52.0)
Hemoglobin: 15.7 g/dL (ref 13.0–17.0)
MCH: 29 pg (ref 26.0–34.0)
MCHC: 33.5 g/dL (ref 30.0–36.0)
MCV: 86.7 fL (ref 78.0–100.0)
PLATELETS: 322 10*3/uL (ref 150–400)
RBC: 5.41 MIL/uL (ref 4.22–5.81)
RDW: 11.8 % (ref 11.5–15.5)
WBC: 9.2 10*3/uL (ref 4.0–10.5)

## 2017-12-11 LAB — I-STAT TROPONIN, ED: Troponin i, poc: 0.02 ng/mL (ref 0.00–0.08)

## 2017-12-11 MED ORDER — KETOROLAC TROMETHAMINE 30 MG/ML IJ SOLN
30.0000 mg | Freq: Once | INTRAMUSCULAR | Status: AC
  Administered 2017-12-11: 30 mg via INTRAMUSCULAR
  Filled 2017-12-11: qty 1

## 2017-12-11 NOTE — ED Triage Notes (Signed)
Woke up with L sided chest pressure at 5am.  Denies sob, nausea, and vomiting.

## 2017-12-11 NOTE — ED Provider Notes (Signed)
[facility] EMERGENCY DEPARTMENT Provider Note   CSN: [id number] Arrival date & time: 12/11/17  1711     History   Chief Complaint Chief Complaint  Patient presents with  . Chest Pain    HPI [name] is a 27 y.o. male with a past medical history of bipolar disorder, hypertension, anxiety, who presents to ED for evaluation of 12-hour history of gradually improving left-sided chest pain/pressure.  States that the pain woke him up from his sleep approximately 12 hours ago.  He reports history of similar symptoms every morning when he wakes up which usually improve with stretching.  He states the pain has gradually started to improve on its own without any interventions.  States that the pain is worse with palpation at that time.  Denies any shortness of breath, hemoptysis, wheezing, history of asthma, alcohol, tobacco or other drug use, history of MI or PE, recent surgeries, recent prolonged travel, hormone use, abdominal pain, vomiting, nausea, cough or other URI symptoms, fever.  HPI  Past Medical History:  Diagnosis Date  . Anxiety   . Arthritis    back  . Asperger's disorder   . Bipolar disorder (HCC)   . Complication of anesthesia     " during an ear surgery I woke up confused which resulted in anger or a panic attack."   . Depression   . GERD (gastroesophageal reflux disease)   . Headache   . Hearing impairment   . Hypertension   . Right leg pain     There are no active problems to display for this patient.   Past Surgical History:  Procedure Laterality Date  . ear surgeries    . FRACTURE SURGERY     right hand 5th digit  . LUMBAR LAMINECTOMY Right 11/10/2016   Procedure: RIGHT SIDED LUMBAR 4-5 MICRODISECTOMY;  Surgeon: Estill Bamberg, MD;  Location: [facility] OR;  Service: Orthopedics;  Laterality: Right;  RIGHT SIDED LUMBAR 4-5 MICRODISECTOMY REQUESTED TIME 2HRS. INTRA-OP X-RAY.  Marland Kitchen MIDDLE EAR SURGERY          Home Medications    Prior to  Admission medications   Medication Sig Start Date End Date Taking? Authorizing Provider  busPIRone (BUSPAR) 30 MG tablet Take 15-30 mg by mouth See admin instructions. Takes 15 mg in the morning and 30 mg at 1400 and 15 mg at bedtime    [provider]  gabapentin (NEURONTIN) 100 MG capsule Take 200 mg by mouth at bedtime.    [provider]  Melatonin 1 MG CAPS Take 2 mg by mouth at bedtime.    [provider]  polyethylene glycol (MIRALAX / GLYCOLAX) packet Take 17 g by mouth daily. Patient not taking: Reported on 11/02/2016 05/30/12   Kathrynn Speed, PA-C  sertraline (ZOLOFT) 50 MG tablet Take 75 mg by mouth at bedtime. Takes 1.5 tablet at bedtime    [provider]  ziprasidone (GEODON) 40 MG capsule Take 40 mg by mouth at bedtime.    [provider]    Family History Family History  Problem Relation Age of Onset  . Hypercholesterolemia Father   . Heart murmur Brother   . Hypertension Brother     Social History Social History   Tobacco Use  . Smoking status: Never Smoker  . Smokeless tobacco: Never Used  Substance Use Topics  . Alcohol use: No  . Drug use: No     Allergies   Other   Review of Systems Review  of Systems  Constitutional: Negative for appetite change, chills and fever.  HENT: Negative for ear pain, rhinorrhea, sneezing and sore throat.   Eyes: Negative for photophobia and visual disturbance.  Respiratory: Negative for cough, chest tightness, shortness of breath and wheezing.   Cardiovascular: Positive for chest pain. Negative for palpitations.  Gastrointestinal: Negative for abdominal pain, blood in stool, constipation, diarrhea, nausea and vomiting.  Genitourinary: Negative for dysuria, hematuria and urgency.  Musculoskeletal: Negative for myalgias.  Skin: Negative for rash.  Neurological: Negative for dizziness, weakness and light-headedness.     Physical Exam Updated Vital Signs BP 132/73   Pulse 74    Temp 98.6 F (37 C) (Oral)   Resp 13   Ht 6\' 4"  (1.93 m)   Wt 112 kg   SpO2 100%   BMI 30.07 kg/m   Physical Exam  Constitutional: He appears well-developed and well-nourished. No distress.  HENT:  Head: Normocephalic and atraumatic.  Nose: Nose normal.  Eyes: Conjunctivae and EOM are normal. Right eye exhibits no discharge. Left eye exhibits no discharge. No scleral icterus.  Neck: Normal range of motion. Neck supple.  Cardiovascular: Normal rate, regular rhythm, normal heart sounds and intact distal pulses. Exam reveals no gallop and no friction rub.  No murmur heard. Pulmonary/Chest: Effort normal and breath sounds normal. No respiratory distress. He exhibits tenderness.    Abdominal: Soft. Bowel sounds are normal. He exhibits no distension. There is no tenderness. There is no guarding.  Musculoskeletal: Normal range of motion. He exhibits no edema.  No lower extremity edema, erythema or calf tenderness bilaterally.  Neurological: He is alert. He exhibits normal muscle tone. Coordination normal.  Skin: Skin is warm and dry. No rash noted.  Psychiatric: He has a normal mood and affect.  Nursing note and vitals reviewed.    ED Treatments / Results  Labs (all labs ordered are listed, but only abnormal results are displayed) Labs Reviewed  BASIC METABOLIC PANEL - Abnormal; Notable for the following components:      Result Value   Glucose, Bld 100 (*)    All other components within normal limits  CBC  I-STAT TROPONIN, ED    EKG EKG Interpretation  Date/Time:  Sunday December 11 2017 17:20:06 EDT Ventricular Rate:  95 PR Interval:  144 QRS Duration: 90 QT Interval:  344 QTC Calculation: 432 R Axis:   83 Text Interpretation:  Normal sinus rhythm Normal ECG No significant change since last tracing Confirmed by Isaacs, Cameron ([id number]) on 12/11/2017 5:23:49 PM   Radiology Dg Chest 2 View  Result Date: 12/11/2017 CLINICAL DATA:  Chest pain on the left EXAM: CHEST - 2  VIEW COMPARISON:  11/04/2016 FINDINGS: Artifact from EKG leads. There is no edema, consolidation, effusion, or pneumothorax. Normal heart size and mediastinal contours. IMPRESSION: Negative chest. Electronically Signed   By: Jonathon  Watts M.D.   On: 12/11/2017 18:22    Procedures Procedures (including critical care time)  Medications Ordered in ED Medications  ketorolac (TORADOL) 30 MG/ML injection 30 mg (has no administration in time range)     Initial Impression / Assessment and Plan / ED Course  I have reviewed the triage vital signs and the nursing notes.  Pertinent labs & imaging results that were available during my care of the patient were reviewed by me and considered in my medical decision making (see chart for details).     27  year old male with past medical history of bipolar disorder, hypertension, anxiety who presents to ED for  evaluation of 12-hour history of gradually improving, constant left-sided chest pain/pressure.  States the pain woke him up from his sleep approximately 12 hours ago.  He reports history of similar symptoms every morning which improved with stretching.  Pain has been constant but has been improving.  He has not taken any medicine help with the pain.  Denies any pleuritic nature of the pain, shortness of breath, hemoptysis, wheezing, alcohol, tobacco or other drug use, recent surgeries, recent prolonged travel, hormone use, history of PE or MI.  He had a history of intermittent palpitations for which he had a Holter monitor placed.  He is followed by his PCP for this.  On exam he is overall well-appearing.  Vital signs are within normal limits.  He is not tachycardic, tachypneic or hypoxic.  No lower extremity edema, erythema or calf tenderness noted.  There is tenderness to palpation of the left side of the chest.  No increased work of breathing noted.  EKG shows normal sinus rhythm with no ischemic changes.  CBC, BMP, troponin unremarkable.  Chest x-ray is  negative for acute abnormality.  Based on the fact that his symptoms have been constant for the past 12hours, a single negative troponin should be sufficient in ruling out cardiac cause.  He is PERC negative and there is no pleuritic chest pain noted.  Suspect that his symptoms could be musculoskeletal in nature based on the fact that they are reproducible with palpation and he had a history of similar symptoms in the past after waking up.  Will advise him to follow-up with his primary care provider and to return to ED for any severe worsening symptoms.  Portions of this note were generated with Scientist, clinical (histocompatibility and immunogenetics). Dictation errors may occur despite best attempts at proofreading.   Final Clinical Impressions(s) / ED Diagnoses   Final diagnoses:  Chest wall pain    ED Discharge Orders    None       Dietrich Pates, PA-C 12/11/17 Margretta Ditty    Shaune Pollack, MD 12/12/17 1256

## 2017-12-11 NOTE — Discharge Instructions (Signed)
Return to ED for worsening symptoms, severe chest pain or abdominal pain, coughing or vomiting up blood, leg swelling, lightheadedness or loss of consciousness.

## 2017-12-11 NOTE — ED Notes (Signed)
Patient transported to X-ray 

## 2019-04-01 IMAGING — DX DG CHEST 2V
2 series · 2 of 2 positions shown · non-contrast
Comparison: 11/04/2016

CLINICAL DATA: Chest pain on the left

EXAM:
CHEST - 2 VIEW

[chest pa]
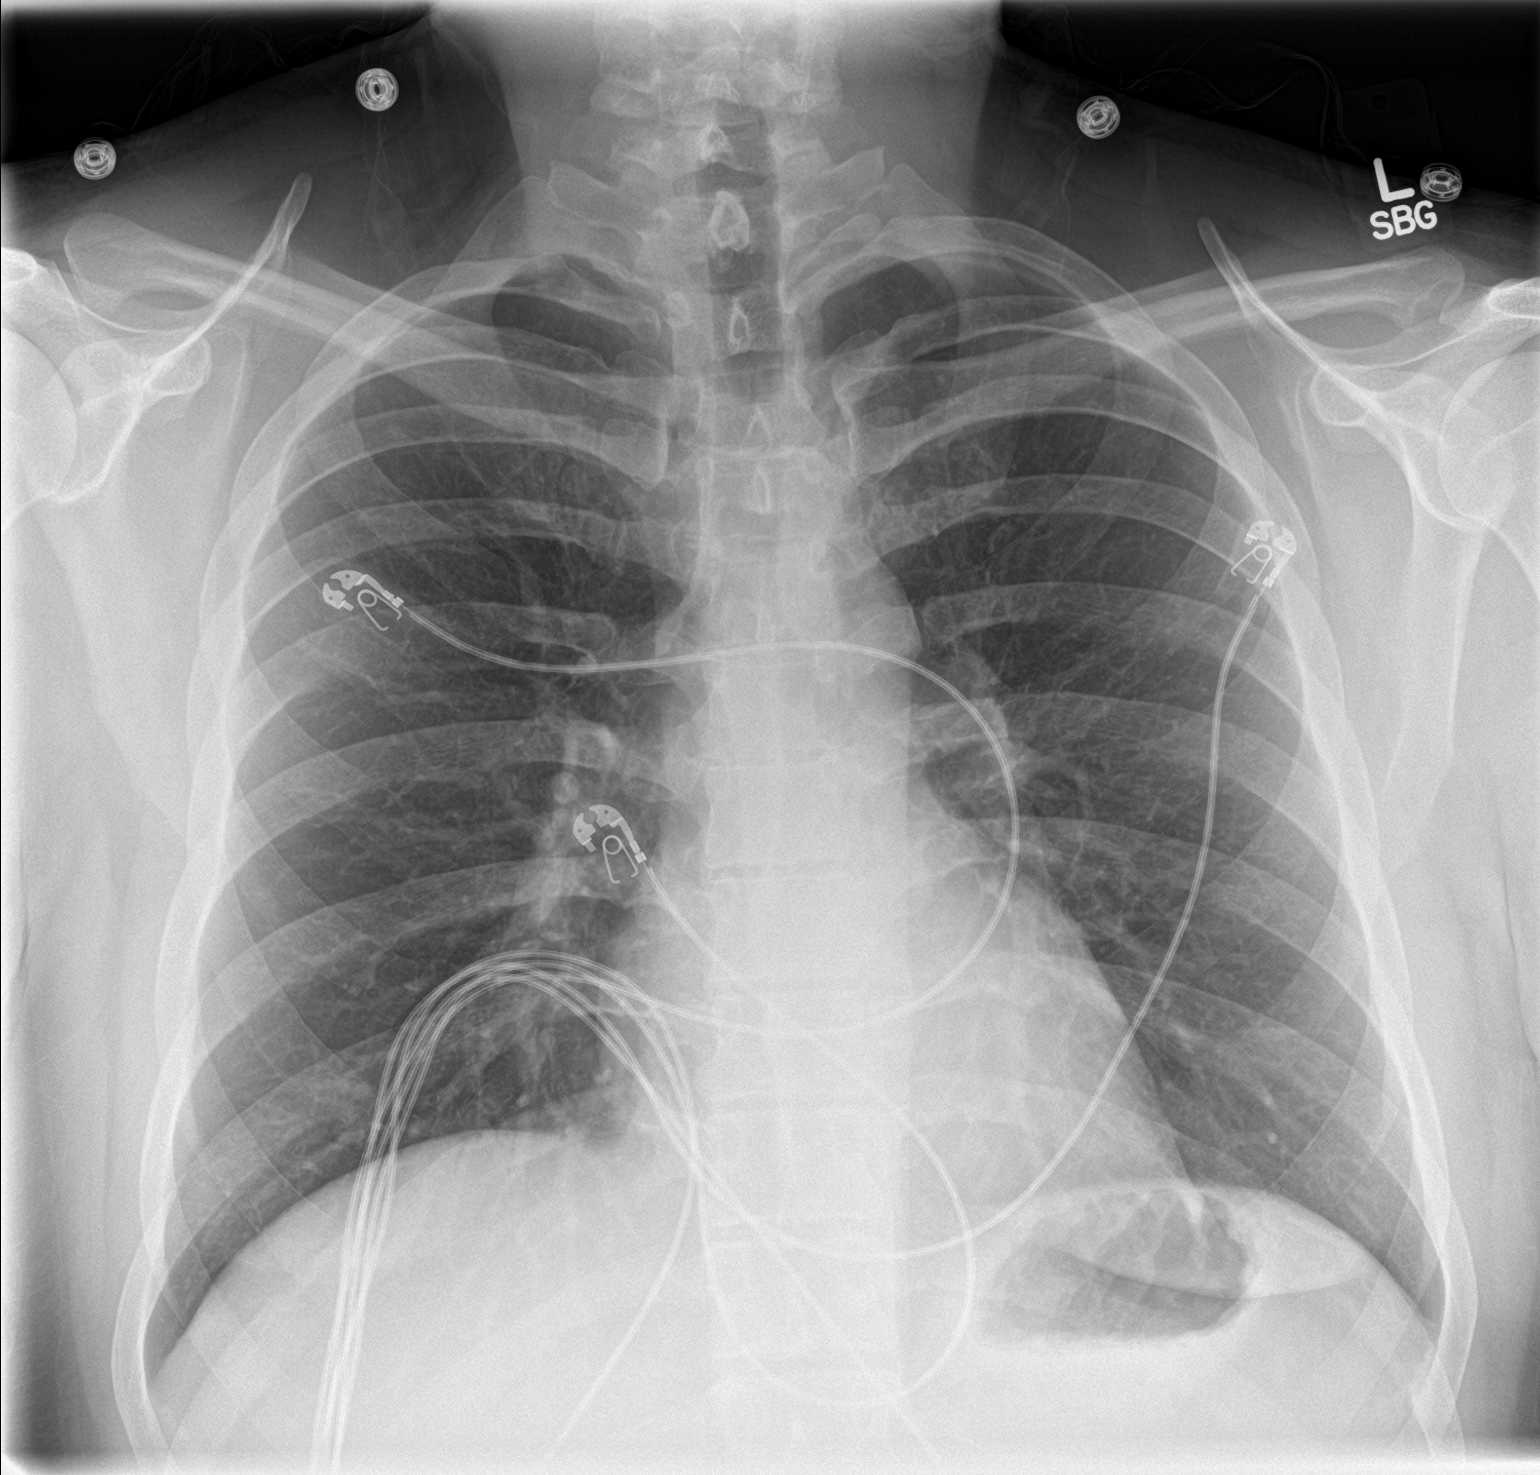

[chest lat]
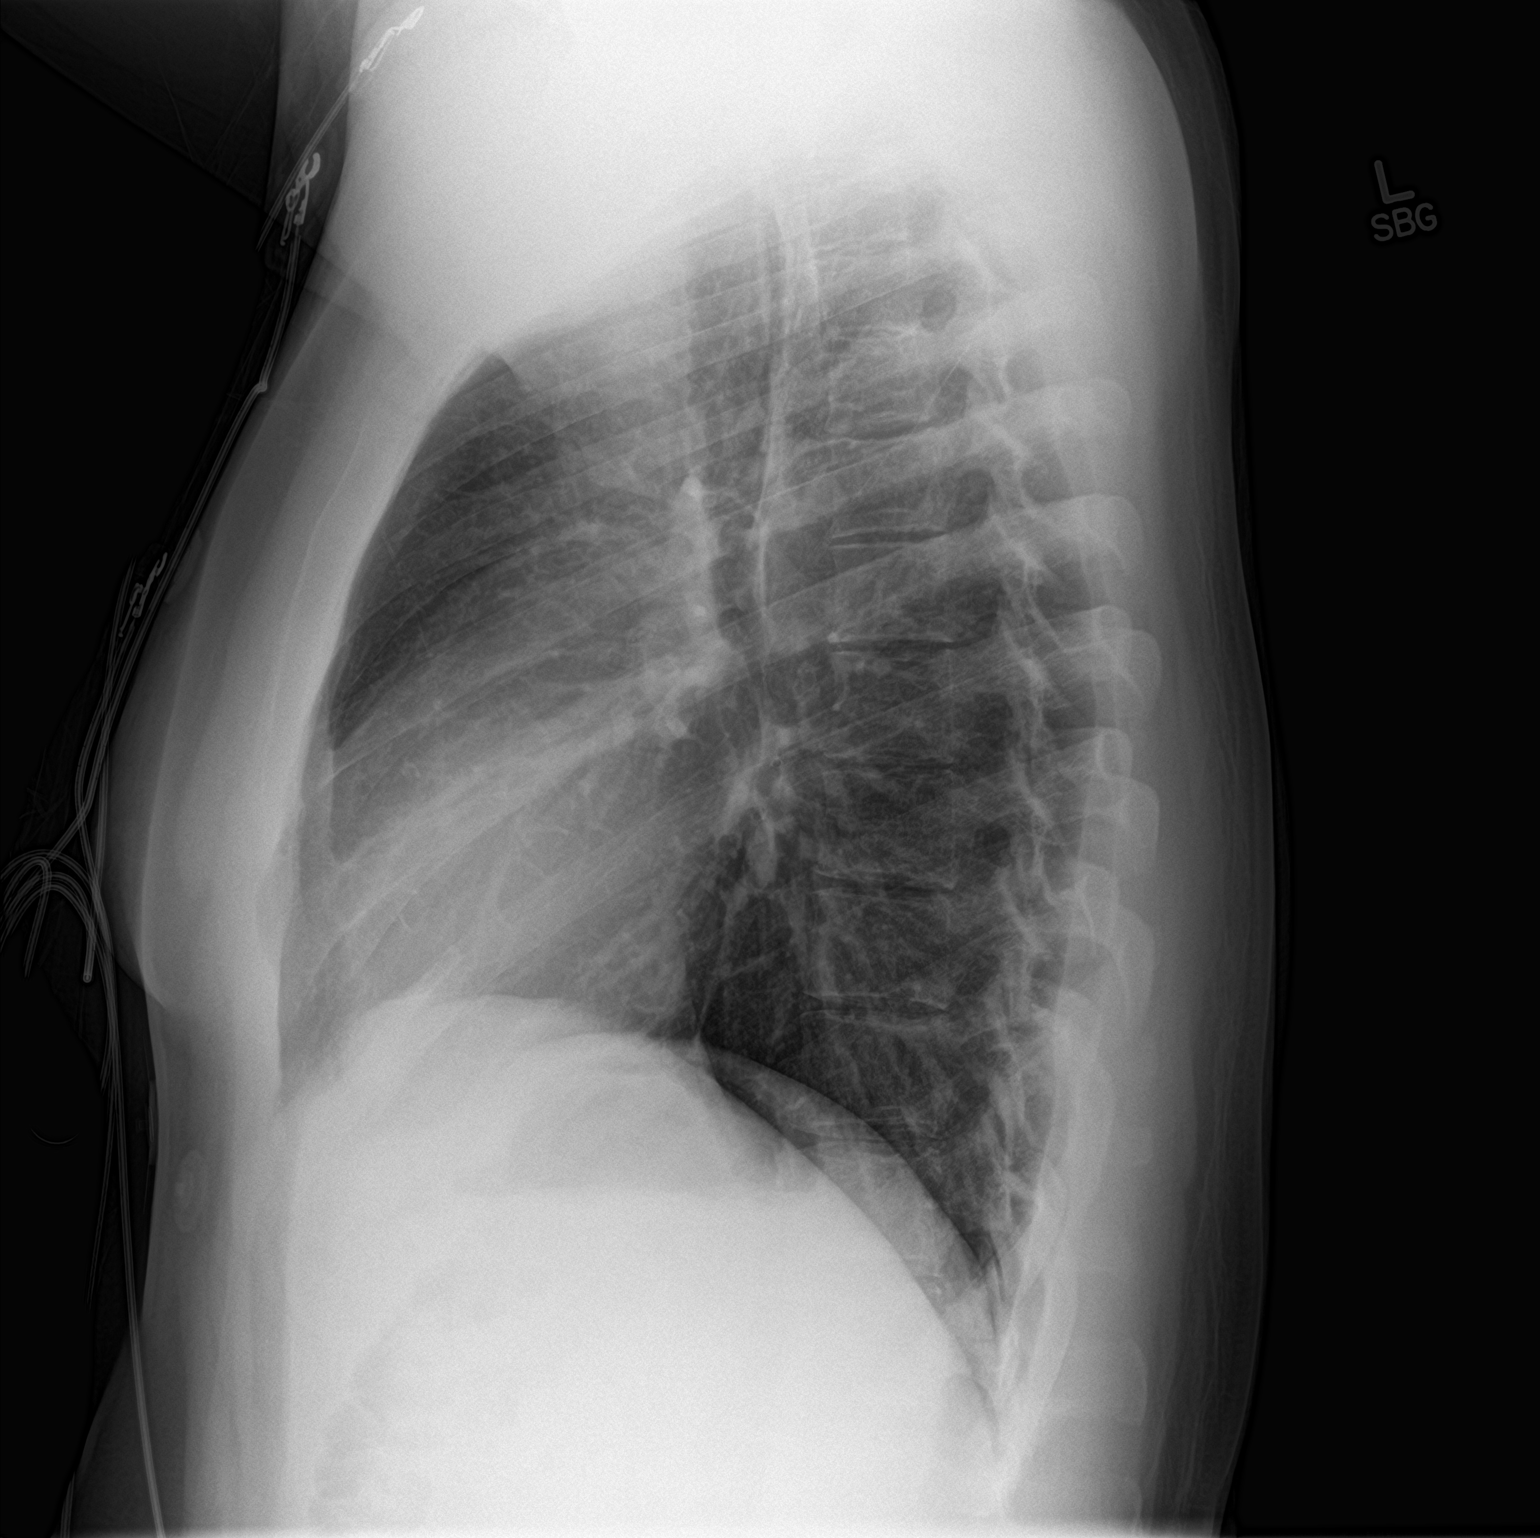

[2 of 2 positions shown; findings below may reference images not displayed]

FINDINGS: Artifact from EKG leads. There is no edema, consolidation, effusion,
or pneumothorax. Normal heart size and mediastinal contours.
IMPRESSION: Negative chest.

## 2023-07-03 ENCOUNTER — Other Ambulatory Visit: Payer: Self-pay

## 2023-07-03 ENCOUNTER — Emergency Department (HOSPITAL_BASED_OUTPATIENT_CLINIC_OR_DEPARTMENT_OTHER)
Admission: EM | Admit: 2023-07-03 | Discharge: 2023-07-03 | Disposition: A | Discharge status: Home / Self Care | Attending: Emergency Medicine | Admitting: Emergency Medicine

## 2023-07-03 ENCOUNTER — Encounter (HOSPITAL_BASED_OUTPATIENT_CLINIC_OR_DEPARTMENT_OTHER): Payer: Self-pay

## 2023-07-03 DIAGNOSIS — Y9367 Activity, basketball: Secondary | ICD-10-CM | POA: Diagnosis not present

## 2023-07-03 DIAGNOSIS — S0101XA Laceration without foreign body of scalp, initial encounter: Secondary | ICD-10-CM | POA: Diagnosis not present

## 2023-07-03 DIAGNOSIS — S0990XA Unspecified injury of head, initial encounter: Secondary | ICD-10-CM | POA: Diagnosis present

## 2023-07-03 DIAGNOSIS — W500XXA Accidental hit or strike by another person, initial encounter: Secondary | ICD-10-CM | POA: Insufficient documentation

## 2023-07-03 MED ORDER — LIDOCAINE HCL (PF) 1 % IJ SOLN
5.0000 mL | Freq: Once | INTRAMUSCULAR | Status: AC
Start: 2023-07-03 — End: 2023-07-03
  Administered 2023-07-03: 5 mL
  Filled 2023-07-03: qty 5

## 2023-07-03 NOTE — Discharge Instructions (Signed)
 You were seen in the emergency department for the cut on your scalp and your head injury.  You had no signs of serious brain injury on your exam.  We washed out your wound and repaired it with 3 staples.  These will need to be removed in 7 to 10 days.  They can be removed by your primary doctor, in urgent care or in the ER.  You can return to the emergency department if you notice any spreading redness from the wound, pus draining, fevers or any other new or concerning symptoms.

## 2023-07-03 NOTE — ED Triage Notes (Signed)
 Was playing basketball and was elbowed in the head.   ~1 cm laceration to right side of head.   Bleeding controlled.

## 2023-07-03 NOTE — ED Notes (Signed)
 Discharge instructions reviewed.   Opportunity for questions and concerns provided.   Alert, oriented and ambulatory.   Displays no signs of distress.   Encouraged to return for staple removal.

## 2023-07-03 NOTE — ED Provider Notes (Signed)
 [place] EMERGENCY DEPARTMENT AT [facility] Provider Note   CSN: [id number] Arrival date & time: 07/03/23  2006     History  Chief Complaint  Patient presents with   Head Injury    [name] is a 33 y.o. male.  Patient is a 33 year old male with no significant past medical history presenting to the emergency department with a head injury.  He states he was playing basketball either earlier in the night when someone came down and elbowed him on the right side of his head.  He denies any loss of consciousness.  He denies any headache, vision changes, nausea or vomiting, numbness or weakness.  He states that he did sustain a laceration to the right side of his head.  He states he is up-to-date on his tetanus.  The history is provided by the patient.  Head Injury      Home Medications Prior to Admission medications   Medication Sig Start Date End Date Taking? Authorizing Provider  busPIRone (BUSPAR) 30 MG tablet Take 15-30 mg by mouth See admin instructions. Takes 15 mg in the morning and 30 mg at 1400 and 15 mg at bedtime    [provider]  gabapentin (NEURONTIN) 100 MG capsule Take 200 mg by mouth at bedtime.    [provider]  Melatonin 1 MG CAPS Take 2 mg by mouth at bedtime.    [provider]  polyethylene glycol (MIRALAX / GLYCOLAX) packet Take 17 g by mouth daily. Patient not taking: Reported on 11/02/2016 05/30/12   Kathrynn Speed, PA-C  sertraline (ZOLOFT) 50 MG tablet Take 75 mg by mouth at bedtime. Takes 1.5 tablet at bedtime    [provider]  ziprasidone (GEODON) 40 MG capsule Take 40 mg by mouth at bedtime.    [provider]      Allergies    Other    Review of Systems   Review of Systems  Physical Exam Updated Vital Signs BP (!) 142/83   Pulse (!) 101   Temp 98.2 F (36.8 C)   Resp 20   Ht 6\' 4"  (1.93 m)   Wt 102.1 kg   SpO2 100%   BMI 27.39 kg/m  Physical Exam Vitals and nursing note  reviewed.  Constitutional:      General: He is not in acute distress.    Appearance: Normal appearance.  HENT:     Head: Normocephalic.     Comments: 2 cm laceration to R anterolateral scalp, mildly gaping, no active bleeding    Nose: Nose normal.     Mouth/Throat:     Mouth: Mucous membranes are moist.  Eyes:     Extraocular Movements: Extraocular movements intact.     Conjunctiva/sclera: Conjunctivae normal.     Pupils: Pupils are equal, round, and reactive to light.  Cardiovascular:     Rate and Rhythm: Normal rate.  Pulmonary:     Effort: Pulmonary effort is normal.  Abdominal:     General: Abdomen is flat.  Musculoskeletal:        General: Normal range of motion.     Cervical back: Normal range of motion.  Skin:    General: Skin is warm and dry.  Neurological:     General: No focal deficit present.     Mental Status: He is alert and oriented to person, place, and time.     Cranial Nerves: No cranial nerve deficit.     Sensory: No sensory deficit.  Motor: No weakness.  Psychiatric:        Mood and Affect: Mood normal.        Behavior: Behavior normal.     ED Results / Procedures / Treatments   Labs (all labs ordered are listed, but only abnormal results are displayed) Labs Reviewed - No data to display  EKG None  Radiology No results found.  Procedures .Laceration Repair  Date/Time: 07/03/2023 10:54 PM  Performed by: Rexford Maus, DO Authorized by: Rexford Maus, DO   Consent:    Consent obtained:  Verbal   Consent given by:  Patient   Risks, benefits, and alternatives were discussed: yes     Risks discussed:  Infection, pain, need for additional repair, poor cosmetic result, nerve damage and poor wound healing   Alternatives discussed:  No treatment and delayed treatment Universal protocol:    Procedure explained and questions answered to patient or proxy's satisfaction: yes     Patient identity confirmed:  Verbally with  patient Anesthesia:    Anesthesia method:  Local infiltration   Local anesthetic:  Lidocaine 1% w/o epi Laceration details:    Location:  Scalp   Scalp location:  R temporal   Length (cm):  2   Depth (mm):  2 Exploration:    Limited defect created (wound extended): no     Hemostasis achieved with:  Cautery   Wound exploration: wound explored through full range of motion and entire depth of wound visualized     Wound extent: areolar tissue not violated, fascia not violated, no foreign body, no signs of injury, no nerve damage, no tendon damage, no underlying fracture and no vascular damage     Contaminated: no   Treatment:    Area cleansed with:  Saline   Amount of cleaning:  Standard   Irrigation solution:  Sterile saline   Irrigation method:  Pressure wash   Visualized foreign bodies/material removed: no     Debridement:  None   Undermining:  None   Scar revision: no   Skin repair:    Repair method:  Staples   Number of staples:  3 Approximation:    Approximation:  Close Repair type:    Repair type:  Simple Post-procedure details:    Dressing:  Open (no dressing)   Procedure completion:  Tolerated well, no immediate complications     Medications Ordered in ED Medications  lidocaine (PF) (XYLOCAINE) 1 % injection 5 mL (has no administration in time range)    ED Course/ Medical Decision Making/ A&P                                 Medical Decision Making This patient presents to the ED with chief complaint(s) of head injury with no pertinent past medical history which further complicates the presenting complaint. The complaint involves an extensive differential diagnosis and also carries with it a high risk of complications and morbidity.    The differential diagnosis includes laceration, abrasion, low risk by [place] head CT rule making ICH or mass effect unlikely, no signs of concussion on exam   Additional history obtained: Additional history obtained from  N/A Records reviewed N/A  ED Course and Reassessment: Patient hemodynamically stable on arrival in no acute distress. No signs of severe head injury. Will need laceration repair. Tetanus is up to date.  Independent labs interpretation:  N/A  Independent visualization of imaging: - n/a  Consultation: - Consulted or discussed management/test interpretation w/ external professional: N/A  Consideration for admission or further workup: Patient has no emergent conditions requiring admission or further work-up at this time and is stable for discharge home with primary care follow-up  Social Determinants of health: N/A    Risk Prescription drug management.           Final Clinical Impression(s) / ED Diagnoses Final diagnoses:  Laceration of scalp, initial encounter    Rx / DC Orders ED Discharge Orders     None         Rexford Maus, DO 07/03/23 2309
